# Patient Record
Sex: Female | Born: 1955 | Race: Black or African American | Hispanic: No | Smoking: Never smoker
Health system: Southern US, Community
[De-identification: ages and names within clinical notes are randomized; demographics above are authoritative.]

## PROBLEM LIST (undated history)

## (undated) DIAGNOSIS — I10 Essential (primary) hypertension: Secondary | ICD-10-CM

## (undated) HISTORY — PX: TUBAL LIGATION: SHX77

---

## 2007-01-29 ENCOUNTER — Emergency Department (HOSPITAL_COMMUNITY): Admission: EM | Admit: 2007-01-29 | Discharge: 2007-01-29 | Payer: Self-pay | Admitting: Emergency Medicine

## 2007-02-03 ENCOUNTER — Encounter: Admission: RE | Admit: 2007-02-03 | Discharge: 2007-02-03 | Payer: Self-pay | Admitting: Family Medicine

## 2007-02-09 ENCOUNTER — Encounter: Admission: RE | Admit: 2007-02-09 | Discharge: 2007-03-16 | Payer: Self-pay | Admitting: Family Medicine

## 2008-08-30 ENCOUNTER — Emergency Department (HOSPITAL_COMMUNITY): Admission: EM | Admit: 2008-08-30 | Discharge: 2008-08-30 | Payer: Self-pay | Admitting: Emergency Medicine

## 2011-06-06 LAB — RAPID STREP SCREEN (MED CTR MEBANE ONLY): Streptococcus, Group A Screen (Direct): NEGATIVE

## 2012-08-07 ENCOUNTER — Emergency Department (HOSPITAL_COMMUNITY)
Admission: EM | Admit: 2012-08-07 | Discharge: 2012-08-07 | Disposition: A | Discharge status: Home / Self Care | Payer: Worker's Compensation | Attending: Emergency Medicine | Admitting: Emergency Medicine

## 2012-08-07 ENCOUNTER — Encounter (HOSPITAL_COMMUNITY): Payer: Self-pay | Admitting: Emergency Medicine

## 2012-08-07 DIAGNOSIS — S01501A Unspecified open wound of lip, initial encounter: Secondary | ICD-10-CM | POA: Insufficient documentation

## 2012-08-07 DIAGNOSIS — Y9289 Other specified places as the place of occurrence of the external cause: Secondary | ICD-10-CM | POA: Insufficient documentation

## 2012-08-07 DIAGNOSIS — Y9389 Activity, other specified: Secondary | ICD-10-CM | POA: Insufficient documentation

## 2012-08-07 DIAGNOSIS — IMO0002 Reserved for concepts with insufficient information to code with codable children: Secondary | ICD-10-CM | POA: Insufficient documentation

## 2012-08-07 DIAGNOSIS — S01511A Laceration without foreign body of lip, initial encounter: Secondary | ICD-10-CM

## 2012-08-07 NOTE — ED Provider Notes (Signed)
History     CSN: [id number]  Arrival date & time 08/07/12  1016   None     No chief complaint on file.   (Consider location/radiation/quality/duration/timing/severity/associated sxs/prior treatment) HPI Comments: This is a 56 year old female, who presents emergency department with chief complaint of lip laceration. She states that the injury occurred while at work. She states that her pain is minimal. She want to come in and be evaluated due to bleeding and the presence of a laceration. Patient has not tried anything to alleviate her symptoms. Nothing makes her symptoms worse or better. Patient denies headache, blurred vision, new hearing loss, sore throat, chest pain, shortness of breath, nausea, vomiting, diarrhea, constipation, dysuria, peripheral edema, back pain, numbness or tingling of the extremities.   The history is provided by the patient. No language interpreter was used.    No past medical history on file.  No past surgical history on file.  No family history on file.  History  Substance Use Topics  . Smoking status: Not on file  . Smokeless tobacco: Not on file  . Alcohol Use: Not on file    OB History    No data available      Review of Systems  All other systems reviewed and are negative.    Allergies  Review of patient's allergies indicates no known allergies.  Home Medications   Current Outpatient Rx  Name  Route  Sig  Dispense  Refill  . AMLODIPINE BESYLATE 10 MG PO TABS   Oral   Take 10 mg by mouth daily.         . ADULT MULTIVITAMIN W/MINERALS CH   Oral   Take 1 tablet by mouth daily.         Marland Kitchen NAPROXEN SODIUM 220 MG PO TABS   Oral   Take 220 mg by mouth as needed. Pain           BP 151/64  Pulse 71  Temp 98.6 F (37 C) (Oral)  Resp 18  SpO2 100%  Physical Exam  Nursing note and vitals reviewed. Constitutional: She is oriented to person, place, and time. She appears well-developed and well-nourished.  HENT:  Head:  Normocephalic and atraumatic.       5 millimeter laceration on the interior upper lip. Bleeding is controlled. The laceration is approximately 1 mm deep. No signs of surrounding infection. The wound does not involve the visible lip or Vermillion border.  Eyes: Conjunctivae normal and EOM are normal.  Neck: Normal range of motion.  Cardiovascular: Normal rate.   Pulmonary/Chest: Effort normal.  Abdominal: She exhibits no distension.  Musculoskeletal: Normal range of motion.  Neurological: She is alert and oriented to person, place, and time.  Skin: Skin is dry.  Psychiatric: She has a normal mood and affect. Her behavior is normal. Judgment and thought content normal.    ED Course  Procedures (including critical care time)  Labs Reviewed - No data to display No results found.   1. Lip laceration       MDM  56 year old female with lip laceration. I have educated the patient regarding lip lacerations, and how this would heal on its own. No treatment is required. I did recommend that the patient use ice and or gel for her comfort. Patient inquired about infection, and I stated that she could use saltwater gargles. Return precautions have been given. Patient understands and agrees with the plan, she is stable and ready for discharge.  Roxy Horseman, PA-C 08/07/12 1048

## 2012-08-07 NOTE — ED Notes (Signed)
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 Pt with very small laceration to inside of upper lip, no bleeding at this time. Minimal swelling to upper lip.

## 2012-08-10 NOTE — ED Provider Notes (Signed)
Medical screening examination/treatment/procedure(s) were performed by non-physician practitioner and as supervising physician I was immediately available for consultation/collaboration.  Toy Baker, MD 08/10/12 [phone]

## 2016-05-12 ENCOUNTER — Encounter (HOSPITAL_COMMUNITY): Payer: Self-pay | Admitting: Emergency Medicine

## 2016-05-12 ENCOUNTER — Emergency Department (HOSPITAL_COMMUNITY)
Admission: EM | Admit: 2016-05-12 | Discharge: 2016-05-12 | Disposition: A | Discharge status: Home / Self Care | Payer: Worker's Compensation | Attending: Emergency Medicine | Admitting: Emergency Medicine

## 2016-05-12 DIAGNOSIS — Y939 Activity, unspecified: Secondary | ICD-10-CM | POA: Diagnosis not present

## 2016-05-12 DIAGNOSIS — Z791 Long term (current) use of non-steroidal anti-inflammatories (NSAID): Secondary | ICD-10-CM | POA: Insufficient documentation

## 2016-05-12 DIAGNOSIS — Y999 Unspecified external cause status: Secondary | ICD-10-CM | POA: Diagnosis not present

## 2016-05-12 DIAGNOSIS — S61012A Laceration without foreign body of left thumb without damage to nail, initial encounter: Secondary | ICD-10-CM

## 2016-05-12 DIAGNOSIS — Y929 Unspecified place or not applicable: Secondary | ICD-10-CM | POA: Diagnosis not present

## 2016-05-12 DIAGNOSIS — I10 Essential (primary) hypertension: Secondary | ICD-10-CM | POA: Diagnosis not present

## 2016-05-12 DIAGNOSIS — Z79899 Other long term (current) drug therapy: Secondary | ICD-10-CM | POA: Diagnosis not present

## 2016-05-12 DIAGNOSIS — W268XXA Contact with other sharp object(s), not elsewhere classified, initial encounter: Secondary | ICD-10-CM | POA: Diagnosis not present

## 2016-05-12 HISTORY — DX: Essential (primary) hypertension: I10

## 2016-05-12 MED ORDER — LIDOCAINE HCL (PF) 1 % IJ SOLN
10.0000 mL | Freq: Once | INTRAMUSCULAR | Status: AC
  Administered 2016-05-12: 10 mL

## 2016-05-12 MED ORDER — LIDOCAINE HCL 1 % IJ SOLN
INTRAMUSCULAR | Status: AC
  Filled 2016-05-12: qty 20

## 2016-05-12 NOTE — ED Provider Notes (Signed)
[facility]-EMERGENCY DEPT Provider Note   CSN: [id number] Arrival date & time: 05/12/16  1128  By signing my name below, I, [name], attest that this documentation has been prepared under the direction and in the presence of [employer], PA-C. Electronically Signed: [name], ED Scribe. 05/12/16. 12:20 PM.  History   Chief Complaint Chief Complaint  Patient presents with  . Laceration    l/thumb laceration. Bleeding controlled   The history is provided by the patient. No language interpreter was used.   HPI Comments: LASHAE Robles is a 60 y.o. female with a hx of HTN who presents to the Emergency Department complaining of a laceration to the dorsal aspect of the left thumb onset 2 hours ago. Pt reports that she accidentally sliced her thumb with a box cutter. Hemostasis achieved with direct pressure. Pt is utd on on her tdap. She denies color change, numbness, or weakness.   Past Medical History:  Diagnosis Date  . Hypertension     There are no active problems to display for this patient.   Past Surgical History:  Procedure Laterality Date  . TUBAL LIGATION      OB History    No data available      Home Medications    Prior to Admission medications   Medication Sig Start Date End Date Taking? Authorizing Provider  amLODipine (NORVASC) 10 MG tablet Take 10 mg by mouth daily.    Historical Provider, MD  Multiple Vitamin (MULTIVITAMIN WITH MINERALS) TABS Take 1 tablet by mouth daily.    Historical Provider, MD  naproxen sodium (ANAPROX) 220 MG tablet Take 220 mg by mouth as needed. Pain    Historical Provider, MD    Family History Family History  Problem Relation Age of Onset  . Hypertension Mother   . Hypertension Father     Social History Social History  Substance Use Topics  . Smoking status: Never Smoker  . Smokeless tobacco: Never Used  . Alcohol use No     Allergies   Review of patient's allergies indicates no known  allergies.   Review of Systems Review of Systems  Skin: Positive for wound. Negative for color change.  Neurological: Negative for weakness and numbness.  A complete 10 system review of systems was obtained and all systems are negative except as noted in the HPI and PMH.   Physical Exam Updated Vital Signs BP 163/78 (BP Location: Right Arm)   Pulse 71   Temp 97.8 F (36.6 C) (Oral)   Resp 18   SpO2 100%   Physical Exam  Constitutional: She is oriented to person, place, and time. She appears well-developed and well-nourished.  HENT:  Head: Normocephalic and atraumatic.  Eyes: Conjunctivae and EOM are normal. Pupils are equal, round, and reactive to light.  Neck: Normal range of motion. Neck supple.  Cardiovascular: Normal rate and regular rhythm.   Pulses:      Radial pulses are 2+ on the right side, and 2+ on the left side.  Pulmonary/Chest: Effort normal.  Musculoskeletal: Normal range of motion.  Left thumb: 2.5 cm linear laceration to the dorsum of the left thumb, bleeding controlled. 2+ radial pulses bilaterally; distal sensation on left hand is intact; full ROM of left thumb  Full ROM of the left thumb Normal muscle strength with resistance with the thumb  Neurological: She is alert and oriented to person, place, and time.  Skin: Skin is warm and dry. Capillary refill takes less than 2 seconds.  Psychiatric: She has a normal mood and affect. Her behavior is normal.  Nursing note and vitals reviewed.  ED Treatments / Results  DIAGNOSTIC STUDIES: Oxygen Saturation is 100% on RA, normal by my interpretation.    COORDINATION OF CARE: 12:18 PM-Discussed treatment plan which includes laceration repair with pt at bedside and pt agreed to plan.    Labs (all labs ordered are listed, but only abnormal results are displayed) Labs Reviewed - No data to display  EKG  EKG Interpretation None       Radiology No results found.  Procedures .Marland Kitchen.Laceration  Repair Date/Time: 05/12/2016 12:20 PM Performed by: Santiago GladLAISURE, Cesia Orf Authorized by: Santiago GladLAISURE, Sammantha Mehlhaff   Consent:    Consent obtained:  Verbal   Consent given by:  Patient Anesthesia (see MAR for exact dosages):    Anesthesia method:  Local infiltration   Local anesthetic:  Lidocaine 1% w/o epi Laceration details:    Location:  Finger   Finger location:  L thumb   Length (cm):  2.5 Repair type:    Repair type:  Simple Pre-procedure details:    Preparation:  Patient was prepped and draped in usual sterile fashion Exploration:    Hemostasis achieved with:  Direct pressure Treatment:    Amount of cleaning:  Standard   Irrigation solution:  Tap water Skin repair:    Repair method:  Sutures   Suture size:  4-0   Suture material:  Prolene   Suture technique:  Simple interrupted   Number of sutures:  3 Approximation:    Approximation:  Close   Vermilion border: well-aligned   Post-procedure details:    Dressing:  Antibiotic ointment (thumb spica)   Patient tolerance of procedure:  Tolerated well, no immediate complications     (including critical care time)  Medications Ordered in ED Medications  lidocaine (PF) (XYLOCAINE) 1 % injection 10 mL (not administered)  lidocaine (XYLOCAINE) 1 % (with pres) injection (not administered)     Initial Impression / Assessment and Plan / ED Course  I have reviewed the triage vital signs and the nursing notes.  Pertinent labs & imaging results that were available during my care of the patient were reviewed by me and considered in my medical decision making (see chart for details).  Clinical Course    Final Clinical Impressions(s) / ED Diagnoses   Pt is utd on tdap. Pressure, irrigation performed. Laceration occurred < 8 hours prior to repair which was well tolerated. No tendon involvement.  Full ROM of the thumb.  Neurovascularly intact.  Pt has no co morbidities to effect normal wound healing. Discussed suture home care w pt and  answered questions. Pt to f-u for wound check and suture removal in 7 days. Pt is hemodynamically stable w no complaints prior to dc.    Final diagnoses:  None  I personally performed the services described in this documentation, which was scribed in my presence. The recorded information has been reviewed and is accurate.    New Prescriptions New Prescriptions   No medications on file     Santiago GladHeather Jaisha Villacres, PA-C 05/12/16 1734    Doug SouSam Jacubowitz, MD 05/13/16 [phone]

## 2016-05-12 NOTE — ED Triage Notes (Signed)
1.5 cm laceration noted.Pt stated that she acciddently sliced the inner aspect of l/thumb with box cutter approx. 40 minutes ago.. Bleeding stopped with pressure. No bleeding at present

## 2016-06-27 ENCOUNTER — Other Ambulatory Visit: Payer: Self-pay | Admitting: Family Medicine

## 2016-06-27 ENCOUNTER — Other Ambulatory Visit (HOSPITAL_COMMUNITY)
Admission: RE | Admit: 2016-06-27 | Discharge: 2016-06-27 | Disposition: A | Discharge status: Home / Self Care | Payer: 59 | Source: Ambulatory Visit | Attending: Family Medicine | Admitting: Family Medicine

## 2016-06-27 DIAGNOSIS — Z01419 Encounter for gynecological examination (general) (routine) without abnormal findings: Secondary | ICD-10-CM | POA: Insufficient documentation

## 2016-06-30 LAB — CYTOLOGY - PAP: DIAGNOSIS: NEGATIVE

## 2016-12-26 DIAGNOSIS — E7800 Pure hypercholesterolemia, unspecified: Secondary | ICD-10-CM | POA: Diagnosis not present

## 2016-12-26 DIAGNOSIS — I10 Essential (primary) hypertension: Secondary | ICD-10-CM | POA: Diagnosis not present

## 2017-05-22 DIAGNOSIS — J309 Allergic rhinitis, unspecified: Secondary | ICD-10-CM | POA: Diagnosis not present

## 2017-05-22 DIAGNOSIS — I10 Essential (primary) hypertension: Secondary | ICD-10-CM | POA: Diagnosis not present

## 2017-08-19 DIAGNOSIS — R238 Other skin changes: Secondary | ICD-10-CM | POA: Diagnosis not present

## 2017-11-20 DIAGNOSIS — I10 Essential (primary) hypertension: Secondary | ICD-10-CM | POA: Diagnosis not present

## 2017-11-20 DIAGNOSIS — E7800 Pure hypercholesterolemia, unspecified: Secondary | ICD-10-CM | POA: Diagnosis not present

## 2018-06-01 DIAGNOSIS — Z0000 Encounter for general adult medical examination without abnormal findings: Secondary | ICD-10-CM | POA: Diagnosis not present

## 2018-06-01 DIAGNOSIS — I10 Essential (primary) hypertension: Secondary | ICD-10-CM | POA: Diagnosis not present

## 2018-08-23 ENCOUNTER — Other Ambulatory Visit: Payer: Self-pay | Admitting: Family Medicine

## 2018-08-23 DIAGNOSIS — Z1231 Encounter for screening mammogram for malignant neoplasm of breast: Secondary | ICD-10-CM

## 2018-08-23 DIAGNOSIS — E2839 Other primary ovarian failure: Secondary | ICD-10-CM

## 2018-10-13 ENCOUNTER — Other Ambulatory Visit: Payer: Self-pay

## 2018-10-13 ENCOUNTER — Ambulatory Visit
Admission: RE | Admit: 2018-10-13 | Discharge: 2018-10-13 | Disposition: A | Discharge status: Home / Self Care | Payer: 59 | Source: Ambulatory Visit | Attending: Family Medicine | Admitting: Family Medicine

## 2018-10-13 DIAGNOSIS — Z1231 Encounter for screening mammogram for malignant neoplasm of breast: Secondary | ICD-10-CM | POA: Diagnosis not present

## 2018-11-17 DIAGNOSIS — I10 Essential (primary) hypertension: Secondary | ICD-10-CM | POA: Diagnosis not present

## 2018-11-17 DIAGNOSIS — G4700 Insomnia, unspecified: Secondary | ICD-10-CM | POA: Diagnosis not present

## 2020-08-28 ENCOUNTER — Other Ambulatory Visit: Payer: Self-pay | Admitting: Family Medicine

## 2020-08-28 DIAGNOSIS — E2839 Other primary ovarian failure: Secondary | ICD-10-CM

## 2020-08-28 DIAGNOSIS — Z1231 Encounter for screening mammogram for malignant neoplasm of breast: Secondary | ICD-10-CM

## 2020-09-03 DIAGNOSIS — U071 COVID-19: Secondary | ICD-10-CM | POA: Diagnosis not present

## 2020-09-03 DIAGNOSIS — R059 Cough, unspecified: Secondary | ICD-10-CM | POA: Diagnosis not present

## 2020-09-05 DIAGNOSIS — U071 COVID-19: Secondary | ICD-10-CM | POA: Diagnosis not present

## 2020-09-05 DIAGNOSIS — R509 Fever, unspecified: Secondary | ICD-10-CM | POA: Diagnosis not present

## 2020-09-05 DIAGNOSIS — R059 Cough, unspecified: Secondary | ICD-10-CM | POA: Diagnosis not present

## 2020-12-13 ENCOUNTER — Other Ambulatory Visit: Payer: 59

## 2020-12-13 ENCOUNTER — Ambulatory Visit
Admission: RE | Admit: 2020-12-13 | Discharge: 2020-12-13 | Disposition: A | Discharge status: Home / Self Care | Payer: 59 | Source: Ambulatory Visit | Attending: Family Medicine | Admitting: Family Medicine

## 2020-12-13 ENCOUNTER — Other Ambulatory Visit: Payer: Self-pay

## 2020-12-13 DIAGNOSIS — E2839 Other primary ovarian failure: Secondary | ICD-10-CM

## 2020-12-13 DIAGNOSIS — Z1231 Encounter for screening mammogram for malignant neoplasm of breast: Secondary | ICD-10-CM | POA: Diagnosis not present

## 2020-12-13 DIAGNOSIS — Z780 Asymptomatic menopausal state: Secondary | ICD-10-CM | POA: Diagnosis not present

## 2020-12-13 DIAGNOSIS — M8589 Other specified disorders of bone density and structure, multiple sites: Secondary | ICD-10-CM | POA: Diagnosis not present

## 2021-01-29 DIAGNOSIS — Z1211 Encounter for screening for malignant neoplasm of colon: Secondary | ICD-10-CM | POA: Diagnosis not present

## 2021-02-13 DIAGNOSIS — Z5320 Procedure and treatment not carried out because of patient's decision for unspecified reasons: Secondary | ICD-10-CM | POA: Diagnosis not present

## 2021-02-13 DIAGNOSIS — F411 Generalized anxiety disorder: Secondary | ICD-10-CM | POA: Diagnosis not present

## 2021-02-13 DIAGNOSIS — E1169 Type 2 diabetes mellitus with other specified complication: Secondary | ICD-10-CM | POA: Diagnosis not present

## 2021-02-13 DIAGNOSIS — E7800 Pure hypercholesterolemia, unspecified: Secondary | ICD-10-CM | POA: Diagnosis not present

## 2021-02-13 DIAGNOSIS — R0981 Nasal congestion: Secondary | ICD-10-CM | POA: Diagnosis not present

## 2021-02-13 DIAGNOSIS — Z1389 Encounter for screening for other disorder: Secondary | ICD-10-CM | POA: Diagnosis not present

## 2021-02-13 DIAGNOSIS — I10 Essential (primary) hypertension: Secondary | ICD-10-CM | POA: Diagnosis not present

## 2021-02-15 DIAGNOSIS — L720 Epidermal cyst: Secondary | ICD-10-CM | POA: Diagnosis not present

## 2021-08-03 DIAGNOSIS — R0981 Nasal congestion: Secondary | ICD-10-CM | POA: Diagnosis not present

## 2021-08-03 DIAGNOSIS — R519 Headache, unspecified: Secondary | ICD-10-CM | POA: Diagnosis not present

## 2021-08-03 DIAGNOSIS — J069 Acute upper respiratory infection, unspecified: Secondary | ICD-10-CM | POA: Diagnosis not present

## 2021-08-03 DIAGNOSIS — R6883 Chills (without fever): Secondary | ICD-10-CM | POA: Diagnosis not present

## 2021-08-03 DIAGNOSIS — U071 COVID-19: Secondary | ICD-10-CM | POA: Diagnosis not present

## 2021-08-19 DIAGNOSIS — H5213 Myopia, bilateral: Secondary | ICD-10-CM | POA: Diagnosis not present

## 2021-08-19 DIAGNOSIS — H524 Presbyopia: Secondary | ICD-10-CM | POA: Diagnosis not present

## 2021-08-19 DIAGNOSIS — H2513 Age-related nuclear cataract, bilateral: Secondary | ICD-10-CM | POA: Diagnosis not present

## 2021-08-30 DIAGNOSIS — J069 Acute upper respiratory infection, unspecified: Secondary | ICD-10-CM | POA: Diagnosis not present

## 2021-09-17 DIAGNOSIS — I10 Essential (primary) hypertension: Secondary | ICD-10-CM | POA: Diagnosis not present

## 2021-09-17 DIAGNOSIS — Z23 Encounter for immunization: Secondary | ICD-10-CM | POA: Diagnosis not present

## 2021-09-17 DIAGNOSIS — E7800 Pure hypercholesterolemia, unspecified: Secondary | ICD-10-CM | POA: Diagnosis not present

## 2021-09-17 DIAGNOSIS — Z0000 Encounter for general adult medical examination without abnormal findings: Secondary | ICD-10-CM | POA: Diagnosis not present

## 2021-09-17 DIAGNOSIS — M8588 Other specified disorders of bone density and structure, other site: Secondary | ICD-10-CM | POA: Diagnosis not present

## 2021-09-17 DIAGNOSIS — Z5320 Procedure and treatment not carried out because of patient's decision for unspecified reasons: Secondary | ICD-10-CM | POA: Diagnosis not present

## 2021-09-17 DIAGNOSIS — E1169 Type 2 diabetes mellitus with other specified complication: Secondary | ICD-10-CM | POA: Diagnosis not present

## 2021-11-13 DIAGNOSIS — M79622 Pain in left upper arm: Secondary | ICD-10-CM | POA: Diagnosis not present

## 2021-11-13 DIAGNOSIS — I10 Essential (primary) hypertension: Secondary | ICD-10-CM | POA: Diagnosis not present

## 2022-01-20 ENCOUNTER — Other Ambulatory Visit: Payer: Self-pay | Admitting: Family Medicine

## 2022-01-20 DIAGNOSIS — Z1231 Encounter for screening mammogram for malignant neoplasm of breast: Secondary | ICD-10-CM

## 2022-02-03 ENCOUNTER — Ambulatory Visit
Admission: RE | Admit: 2022-02-03 | Discharge: 2022-02-03 | Disposition: A | Discharge status: Home / Self Care | Payer: No Typology Code available for payment source | Source: Ambulatory Visit | Attending: Family Medicine | Admitting: Family Medicine

## 2022-02-03 DIAGNOSIS — Z1231 Encounter for screening mammogram for malignant neoplasm of breast: Secondary | ICD-10-CM | POA: Diagnosis not present

## 2022-02-05 ENCOUNTER — Ambulatory Visit: Payer: 59

## 2022-02-05 ENCOUNTER — Other Ambulatory Visit: Payer: Self-pay | Admitting: Family Medicine

## 2022-02-05 ENCOUNTER — Ambulatory Visit
Admission: RE | Admit: 2022-02-05 | Discharge: 2022-02-05 | Disposition: A | Discharge status: Home / Self Care | Payer: 59 | Source: Ambulatory Visit | Attending: Family Medicine | Admitting: Family Medicine

## 2022-02-05 DIAGNOSIS — R928 Other abnormal and inconclusive findings on diagnostic imaging of breast: Secondary | ICD-10-CM

## 2022-03-12 DIAGNOSIS — E7800 Pure hypercholesterolemia, unspecified: Secondary | ICD-10-CM | POA: Diagnosis not present

## 2022-03-12 DIAGNOSIS — Z23 Encounter for immunization: Secondary | ICD-10-CM | POA: Diagnosis not present

## 2022-03-12 DIAGNOSIS — E1169 Type 2 diabetes mellitus with other specified complication: Secondary | ICD-10-CM | POA: Diagnosis not present

## 2022-03-12 DIAGNOSIS — Z5320 Procedure and treatment not carried out because of patient's decision for unspecified reasons: Secondary | ICD-10-CM | POA: Diagnosis not present

## 2022-03-12 DIAGNOSIS — F411 Generalized anxiety disorder: Secondary | ICD-10-CM | POA: Diagnosis not present

## 2022-03-12 DIAGNOSIS — I10 Essential (primary) hypertension: Secondary | ICD-10-CM | POA: Diagnosis not present

## 2022-06-03 DIAGNOSIS — F17211 Nicotine dependence, cigarettes, in remission: Secondary | ICD-10-CM | POA: Diagnosis not present

## 2022-06-03 DIAGNOSIS — E669 Obesity, unspecified: Secondary | ICD-10-CM | POA: Diagnosis not present

## 2022-06-03 DIAGNOSIS — E119 Type 2 diabetes mellitus without complications: Secondary | ICD-10-CM | POA: Diagnosis not present

## 2022-06-03 DIAGNOSIS — I10 Essential (primary) hypertension: Secondary | ICD-10-CM | POA: Diagnosis not present

## 2022-06-03 DIAGNOSIS — Z6831 Body mass index (BMI) 31.0-31.9, adult: Secondary | ICD-10-CM | POA: Diagnosis not present

## 2022-06-03 DIAGNOSIS — F419 Anxiety disorder, unspecified: Secondary | ICD-10-CM | POA: Diagnosis not present

## 2022-06-03 DIAGNOSIS — Z79899 Other long term (current) drug therapy: Secondary | ICD-10-CM | POA: Diagnosis not present

## 2022-06-03 DIAGNOSIS — Z008 Encounter for other general examination: Secondary | ICD-10-CM | POA: Diagnosis not present

## 2022-10-14 DIAGNOSIS — H269 Unspecified cataract: Secondary | ICD-10-CM | POA: Diagnosis not present

## 2022-10-14 DIAGNOSIS — E7800 Pure hypercholesterolemia, unspecified: Secondary | ICD-10-CM | POA: Diagnosis not present

## 2022-10-14 DIAGNOSIS — Z1331 Encounter for screening for depression: Secondary | ICD-10-CM | POA: Diagnosis not present

## 2022-10-14 DIAGNOSIS — M8588 Other specified disorders of bone density and structure, other site: Secondary | ICD-10-CM | POA: Diagnosis not present

## 2022-10-14 DIAGNOSIS — F411 Generalized anxiety disorder: Secondary | ICD-10-CM | POA: Diagnosis not present

## 2022-10-14 DIAGNOSIS — I10 Essential (primary) hypertension: Secondary | ICD-10-CM | POA: Diagnosis not present

## 2022-10-14 DIAGNOSIS — Z5320 Procedure and treatment not carried out because of patient's decision for unspecified reasons: Secondary | ICD-10-CM | POA: Diagnosis not present

## 2022-10-14 DIAGNOSIS — Z0000 Encounter for general adult medical examination without abnormal findings: Secondary | ICD-10-CM | POA: Diagnosis not present

## 2022-10-14 DIAGNOSIS — E1136 Type 2 diabetes mellitus with diabetic cataract: Secondary | ICD-10-CM | POA: Diagnosis not present

## 2022-12-09 DIAGNOSIS — R109 Unspecified abdominal pain: Secondary | ICD-10-CM | POA: Diagnosis not present

## 2022-12-09 DIAGNOSIS — J3489 Other specified disorders of nose and nasal sinuses: Secondary | ICD-10-CM | POA: Diagnosis not present

## 2022-12-09 DIAGNOSIS — I10 Essential (primary) hypertension: Secondary | ICD-10-CM | POA: Diagnosis not present

## 2023-02-03 ENCOUNTER — Other Ambulatory Visit: Payer: Self-pay | Admitting: Family Medicine

## 2023-02-03 DIAGNOSIS — Z1231 Encounter for screening mammogram for malignant neoplasm of breast: Secondary | ICD-10-CM

## 2023-02-19 IMAGING — MG MM DIGITAL DIAGNOSTIC UNILAT*R* W/ TOMO W/ CAD
6 series · 6 of 18 positions shown · non-contrast
Comparison: Previous exam(s).

CLINICAL DATA: 65-year-old female for further evaluation of
possible RIGHT breast distortion identified on screening mammogram.

EXAM:
DIGITAL DIAGNOSTIC UNILATERAL RIGHT MAMMOGRAM WITH TOMOSYNTHESIS AND
CAD
TECHNIQUE: Right digital diagnostic mammography and breast tomosynthesis was
performed. The images were evaluated with computer-aided detection.

[R MLO synth-2D]
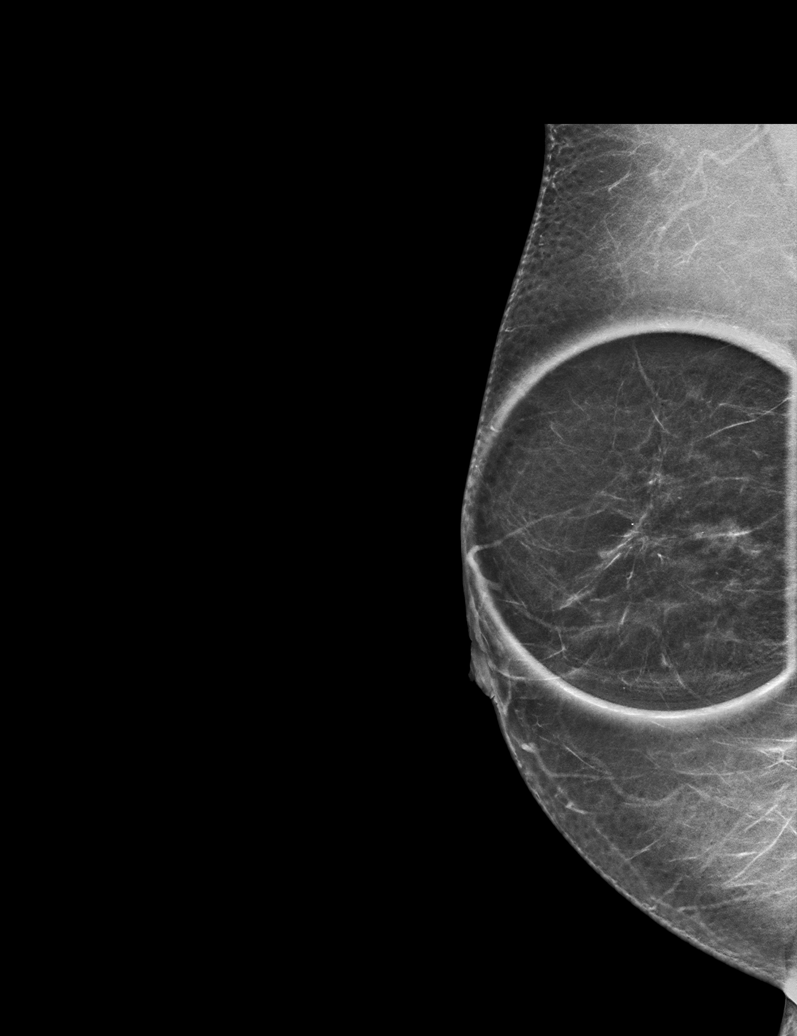

[R ML synth-2D (1 of 2)]
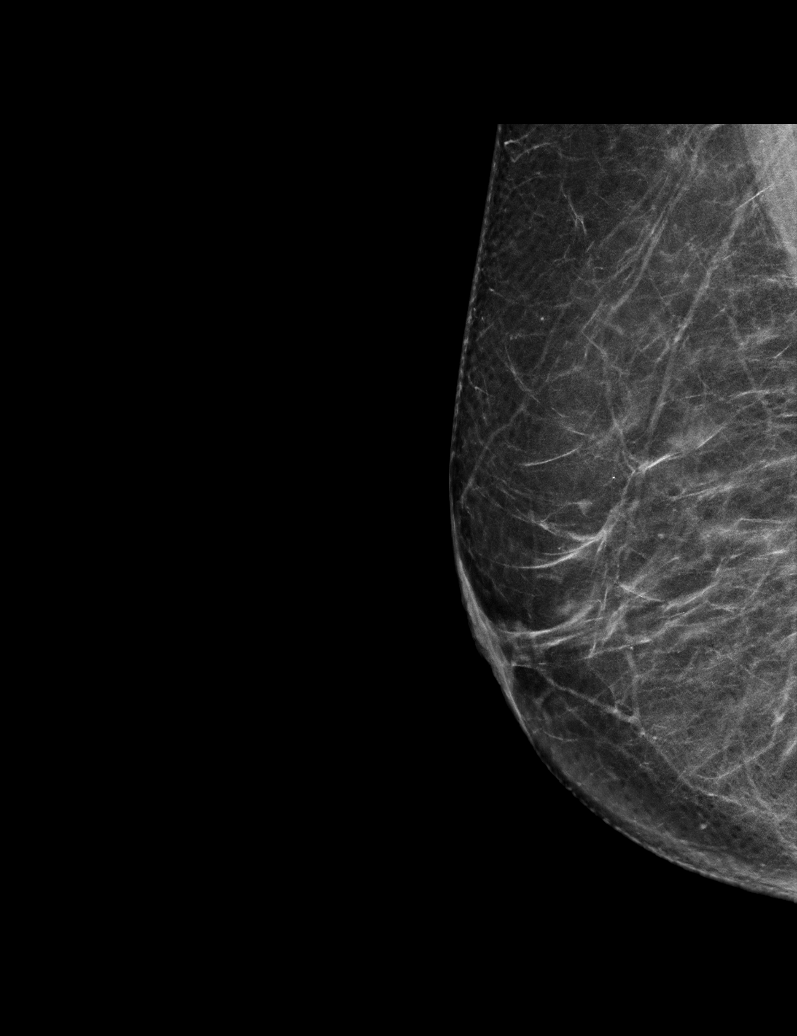

[R ML synth-2D (2 of 2)]
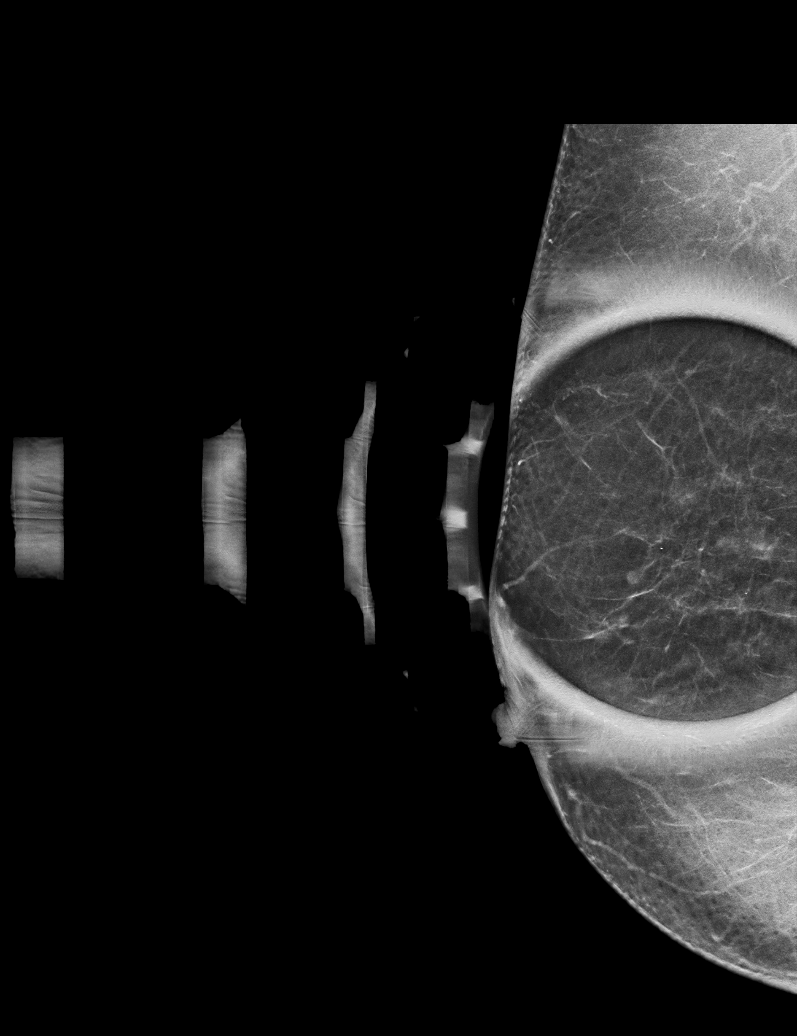

[R MLO tomo · tomo slice 28/55.0]
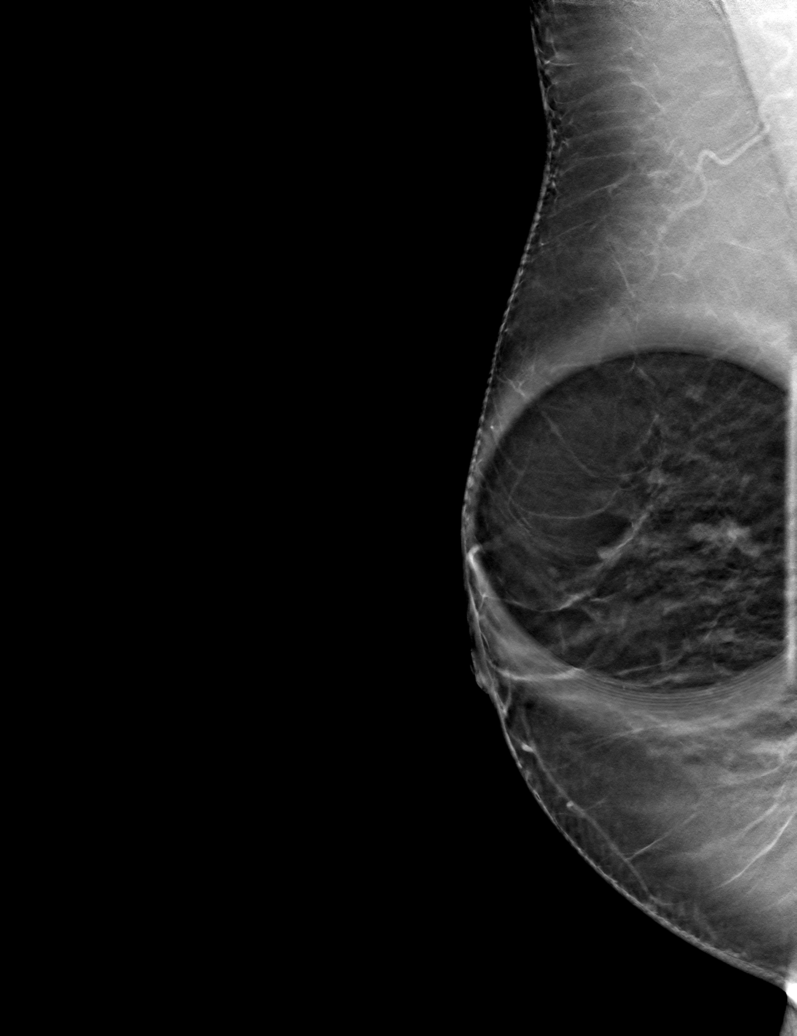

[R ML tomo (1 of 2) · tomo slice 35/69.0]
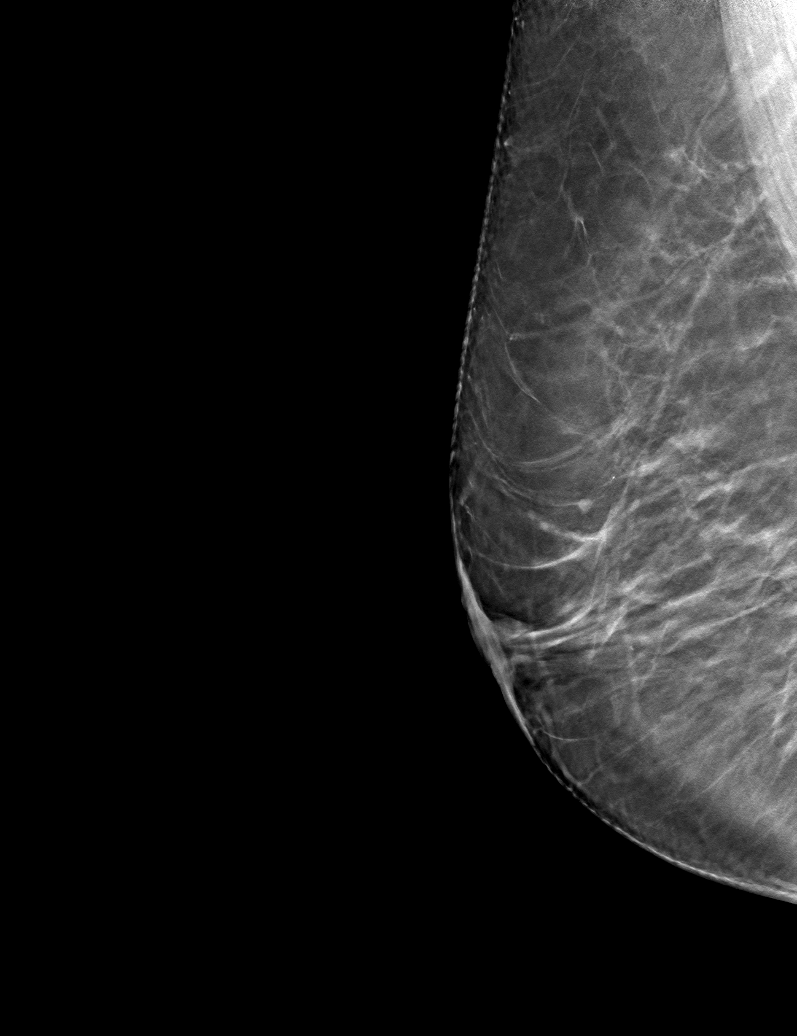

[R ML tomo (2 of 2) · tomo slice 29/56.0]
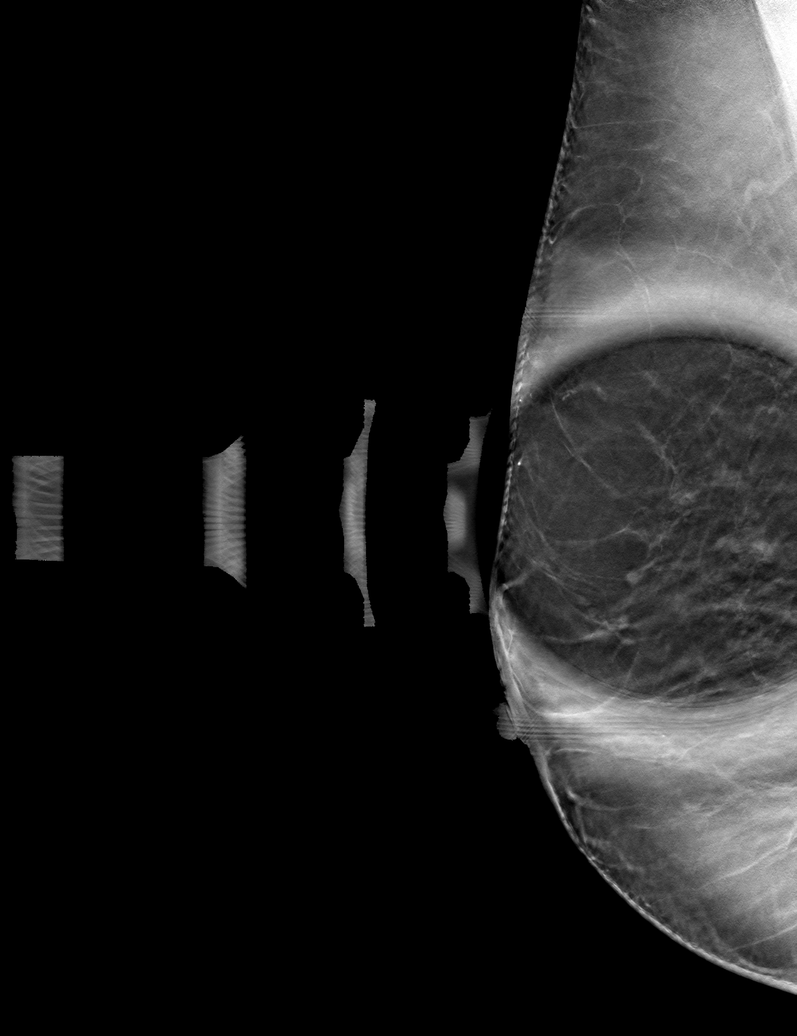

[6 of 18 positions shown; findings below may reference images not displayed]

ACR Breast Density Category b: There are scattered areas of
fibroglandular density.
FINDINGS: Full field and spot compression views of the RIGHT breast
demonstrate dispersal of the screening study finding without
persistent suspicious abnormality in this area.
IMPRESSION: No persistent suspicious abnormality at site of the screening study
finding.

RECOMMENDATION:
Bilateral screening mammogram in 1 year.

I have discussed the findings and recommendations with the patient.
If applicable, a reminder letter will be sent to the patient
regarding the next appointment.

BI-RADS CATEGORY  1: Negative.

## 2023-03-11 ENCOUNTER — Ambulatory Visit: Payer: No Typology Code available for payment source

## 2023-04-01 ENCOUNTER — Ambulatory Visit
Admission: RE | Admit: 2023-04-01 | Discharge: 2023-04-01 | Disposition: A | Discharge status: Home / Self Care | Payer: No Typology Code available for payment source | Source: Ambulatory Visit | Attending: Family Medicine | Admitting: Family Medicine

## 2023-04-01 DIAGNOSIS — Z1231 Encounter for screening mammogram for malignant neoplasm of breast: Secondary | ICD-10-CM | POA: Diagnosis not present

## 2023-04-22 DIAGNOSIS — E1136 Type 2 diabetes mellitus with diabetic cataract: Secondary | ICD-10-CM | POA: Diagnosis not present

## 2023-04-22 DIAGNOSIS — H269 Unspecified cataract: Secondary | ICD-10-CM | POA: Diagnosis not present

## 2023-04-22 DIAGNOSIS — I10 Essential (primary) hypertension: Secondary | ICD-10-CM | POA: Diagnosis not present

## 2023-04-22 DIAGNOSIS — E7800 Pure hypercholesterolemia, unspecified: Secondary | ICD-10-CM | POA: Diagnosis not present

## 2023-04-22 DIAGNOSIS — F411 Generalized anxiety disorder: Secondary | ICD-10-CM | POA: Diagnosis not present

## 2023-07-06 DIAGNOSIS — I10 Essential (primary) hypertension: Secondary | ICD-10-CM | POA: Diagnosis not present

## 2023-07-06 DIAGNOSIS — Z008 Encounter for other general examination: Secondary | ICD-10-CM | POA: Diagnosis not present

## 2023-08-06 DIAGNOSIS — H2513 Age-related nuclear cataract, bilateral: Secondary | ICD-10-CM | POA: Diagnosis not present

## 2023-08-06 DIAGNOSIS — R7303 Prediabetes: Secondary | ICD-10-CM | POA: Diagnosis not present

## 2023-11-16 DIAGNOSIS — E7800 Pure hypercholesterolemia, unspecified: Secondary | ICD-10-CM | POA: Diagnosis not present

## 2023-11-16 DIAGNOSIS — H269 Unspecified cataract: Secondary | ICD-10-CM | POA: Diagnosis not present

## 2023-11-16 DIAGNOSIS — J309 Allergic rhinitis, unspecified: Secondary | ICD-10-CM | POA: Diagnosis not present

## 2023-11-16 DIAGNOSIS — Z5320 Procedure and treatment not carried out because of patient's decision for unspecified reasons: Secondary | ICD-10-CM | POA: Diagnosis not present

## 2023-11-16 DIAGNOSIS — Z1331 Encounter for screening for depression: Secondary | ICD-10-CM | POA: Diagnosis not present

## 2023-11-16 DIAGNOSIS — M8588 Other specified disorders of bone density and structure, other site: Secondary | ICD-10-CM | POA: Diagnosis not present

## 2023-11-16 DIAGNOSIS — Z0000 Encounter for general adult medical examination without abnormal findings: Secondary | ICD-10-CM | POA: Diagnosis not present

## 2023-11-16 DIAGNOSIS — E1136 Type 2 diabetes mellitus with diabetic cataract: Secondary | ICD-10-CM | POA: Diagnosis not present

## 2023-11-16 DIAGNOSIS — F411 Generalized anxiety disorder: Secondary | ICD-10-CM | POA: Diagnosis not present

## 2023-11-16 DIAGNOSIS — I10 Essential (primary) hypertension: Secondary | ICD-10-CM | POA: Diagnosis not present

## 2024-01-30 DIAGNOSIS — H269 Unspecified cataract: Secondary | ICD-10-CM | POA: Diagnosis not present

## 2024-01-30 DIAGNOSIS — E7800 Pure hypercholesterolemia, unspecified: Secondary | ICD-10-CM | POA: Diagnosis not present

## 2024-01-30 DIAGNOSIS — F411 Generalized anxiety disorder: Secondary | ICD-10-CM | POA: Diagnosis not present

## 2024-01-30 DIAGNOSIS — I10 Essential (primary) hypertension: Secondary | ICD-10-CM | POA: Diagnosis not present

## 2024-02-29 DIAGNOSIS — E7800 Pure hypercholesterolemia, unspecified: Secondary | ICD-10-CM | POA: Diagnosis not present

## 2024-02-29 DIAGNOSIS — H269 Unspecified cataract: Secondary | ICD-10-CM | POA: Diagnosis not present

## 2024-02-29 DIAGNOSIS — I10 Essential (primary) hypertension: Secondary | ICD-10-CM | POA: Diagnosis not present

## 2024-02-29 DIAGNOSIS — F411 Generalized anxiety disorder: Secondary | ICD-10-CM | POA: Diagnosis not present

## 2024-03-15 NOTE — Progress Notes (Deleted)
 New Patient Note  RE: Julia Robles MRN: [id number] DOB: 04-29-1956 Date of Office Visit: 03/16/2024  Consult requested by: Sun, Vyvyan, MD Primary care provider: Sun, Vyvyan, MD  Chief Complaint: No chief complaint on file.  History of Present Illness: I had the pleasure of seeing Julia Robles for initial evaluation at the [facility] on 03/16/2024. She is a 68 y.o. female, who is referred here by Sun, Vyvyan, MD for the evaluation of allergic rhinitis.  Discussed the use of AI scribe software for clinical note transcription with the patient, who gave verbal consent to proceed.  History of Present Illness             She reports symptoms of ***. Symptoms have been going on for *** years. The symptoms are present *** all year around with worsening in ***. Other triggers include exposure to ***. Anosmia: ***. Headache: ***. She has used *** with ***fair improvement in symptoms. Sinus infections: ***. Previous work up includes: ***. Previous ENT evaluation: ***. Previous sinus imaging: ***. History of nasal polyps: ***. Last eye exam: ***. History of reflux: ***.  Assessment and Plan: [name] is a 68 y.o. female with: ***  Assessment and Plan               No follow-ups on file.  No orders of the defined types were placed in this encounter.  Lab Orders  No laboratory test(s) ordered today    Other allergy screening: Asthma: {Blank single:19197::yes,no} Rhino conjunctivitis: {Blank single:19197::yes,no} Food allergy: {Blank single:19197::yes,no} Medication allergy: {Blank single:19197::yes,no} Hymenoptera allergy: {Blank single:19197::yes,no} Urticaria: {Blank single:19197::yes,no} Eczema:{Blank single:19197::yes,no} History of recurrent infections suggestive of immunodeficency: {Blank single:19197::yes,no}  Diagnostics: Spirometry:  Tracings reviewed. Her effort: {Blank single:19197::Good reproducible  efforts.,It was hard to get consistent efforts and there is a question as to whether this reflects a maximal maneuver.,Poor effort, data can not be interpreted.} FVC: ***L FEV1: ***L, ***% predicted FEV1/FVC ratio: ***% Interpretation: {Blank single:19197::Spirometry consistent with mild obstructive disease,Spirometry consistent with moderate obstructive disease,Spirometry consistent with severe obstructive disease,Spirometry consistent with possible restrictive disease,Spirometry consistent with mixed obstructive and restrictive disease,Spirometry uninterpretable due to technique,Spirometry consistent with normal pattern,No overt abnormalities noted given today's efforts}.  Please see scanned spirometry results for details.  Skin Testing: {Blank single:19197::Select foods,Environmental allergy panel,Environmental allergy panel and select foods,Food allergy panel,None,Deferred due to recent antihistamines use}. *** Results discussed with patient/family.   Past Medical History: There are no active problems to display for this patient.  Past Medical History:  Diagnosis Date  . Hypertension    Past Surgical History: Past Surgical History:  Procedure Laterality Date  . TUBAL LIGATION     Medication List:  Current Outpatient Medications  Medication Sig Dispense Refill  . amLODipine (NORVASC) 10 MG tablet Take 10 mg by mouth daily.    . Multiple Vitamin (MULTIVITAMIN WITH MINERALS) TABS Take 1 tablet by mouth daily.    . naproxen sodium (ANAPROX) 220 MG tablet Take 220 mg by mouth as needed. Pain     No current facility-administered medications for this visit.   Allergies: No Known Allergies Social History: Social History   Socioeconomic History  . Marital status: Widowed    Spouse name: Not on file  . Number of children: Not on file  . Years of education: Not on file  . Highest education level: Not on file  Occupational History  . Not on file   Tobacco Use  . Smoking status: Never  . Smokeless  tobacco: Never  Substance and Sexual Activity  . Alcohol use: No  . Drug use: Not on file  . Sexual activity: Not on file  Other Topics Concern  . Not on file  Social History Narrative  . Not on file   Social Drivers of Health   Financial Resource Strain: Not on file  Food Insecurity: Not on file  Transportation Needs: Not on file  Physical Activity: Not on file  Stress: Not on file  Social Connections: Not on file   Lives in a ***. Smoking: *** Occupation: ***  Environmental HistorySurveyor, minerals in the house: Network engineer in the family room: {Blank single:19197::yes,no} Carpet in the bedroom: {Blank single:19197::yes,no} Heating: {Blank single:19197::electric,gas,heat pump} Cooling: {Blank single:19197::central,window,heat pump} Pet: {Blank single:19197::yes ***,no}  Family History: Family History  Problem Relation Age of Onset  . Hypertension Mother   . Hypertension Father   . Breast cancer Neg Hx    Problem                               Relation Asthma                                   *** Eczema                                *** Food allergy                          *** Allergic rhino conjunctivitis     ***  Review of Systems  Constitutional:  Negative for appetite change, chills, fever and unexpected weight change.  HENT:  Negative for congestion and rhinorrhea.   Eyes:  Negative for itching.  Respiratory:  Negative for cough, chest tightness, shortness of breath and wheezing.   Cardiovascular:  Negative for chest pain.  Gastrointestinal:  Negative for abdominal pain.  Genitourinary:  Negative for difficulty urinating.  Skin:  Negative for rash.  Neurological:  Negative for headaches.    Objective: There were no vitals taken for this visit. There is no height or weight on file to calculate BMI. Physical Exam Vitals and nursing note  reviewed.  Constitutional:      Appearance: Normal appearance. She is well-developed.  HENT:     Head: Normocephalic and atraumatic.     Right Ear: Tympanic membrane and external ear normal.     Left Ear: Tympanic membrane and external ear normal.     Nose: Nose normal.     Mouth/Throat:     Mouth: Mucous membranes are moist.     Pharynx: Oropharynx is clear.  Eyes:     Conjunctiva/sclera: Conjunctivae normal.  Cardiovascular:     Rate and Rhythm: Normal rate and regular rhythm.     Heart sounds: Normal heart sounds. No murmur heard.    No friction rub. No gallop.  Pulmonary:     Effort: Pulmonary effort is normal.     Breath sounds: Normal breath sounds. No wheezing, rhonchi or rales.  Musculoskeletal:     Cervical back: Neck supple.  Skin:    General: Skin is warm.     Findings: No rash.  Neurological:     Mental Status: She is alert and oriented to person, place, and time.  Psychiatric:  Behavior: Behavior normal.   The plan was reviewed with the patient/family, and all questions/concerned were addressed.  It was my pleasure to see Kamiryn today and participate in her care. Please feel free to contact me with any questions or concerns.  Sincerely,  Orlan Cramp, DO Allergy & Immunology  [facility]  [facility] office: [phone] [facility] office: [phone]

## 2024-03-16 ENCOUNTER — Ambulatory Visit: Payer: Self-pay | Admitting: Allergy

## 2024-03-18 ENCOUNTER — Encounter: Payer: Self-pay | Admitting: Advanced Practice Midwife

## 2024-03-21 ENCOUNTER — Ambulatory Visit (INDEPENDENT_AMBULATORY_CARE_PROVIDER_SITE_OTHER): Payer: Self-pay | Admitting: Internal Medicine

## 2024-03-21 ENCOUNTER — Encounter: Payer: Self-pay | Admitting: Internal Medicine

## 2024-03-21 ENCOUNTER — Other Ambulatory Visit: Payer: Self-pay

## 2024-03-21 VITALS — BP 138/78 | HR 60 | Temp 97.9°F | Resp 18 | Ht 69.0 in | Wt 228.3 lb

## 2024-03-21 DIAGNOSIS — J3489 Other specified disorders of nose and nasal sinuses: Secondary | ICD-10-CM | POA: Diagnosis not present

## 2024-03-21 DIAGNOSIS — J329 Chronic sinusitis, unspecified: Secondary | ICD-10-CM

## 2024-03-21 DIAGNOSIS — J310 Chronic rhinitis: Secondary | ICD-10-CM | POA: Diagnosis not present

## 2024-03-21 DIAGNOSIS — B9689 Other specified bacterial agents as the cause of diseases classified elsewhere: Secondary | ICD-10-CM

## 2024-03-21 MED ORDER — AMOXICILLIN-POT CLAVULANATE 875-125 MG PO TABS: 1.0000 | ORAL_TABLET | Freq: Two times a day (BID) | ORAL | 0 refills | Status: AC

## 2024-03-21 NOTE — Progress Notes (Signed)
 NEW PATIENT Date of Service/Encounter:   03/21/2024 Referring provider: Sun, Vyvyan, MD Primary care provider: Sun, Vyvyan, MD  Subjective:  Julia Robles is a 68 y.o. female with a PMHx of DM2, hypercholesteremia, GAD, HTN, cataracts presenting today for evaluation of sinus pressure and pain. History obtained from: chart review and patient.   Discussed the use of AI scribe software for clinical note transcription with the patient, who gave verbal consent to proceed.  History of Present Illness Julia Robles is a 68 year old female who presents with worsening sinus pressure and pain.  Chronic sinus pressure and pain - Sinus pressure and pain present for the past three months, worsening compared to baseline symptoms - Pressure primarily located around the eyes, with sensation of pressure extending toward the ears - Symptoms more severe than previous intermittent episodes, possibly exacerbated by weather changes, especially during rainy seasons - No current nasal drainage, but has experienced drainage in the past - No fevers associated with symptoms - No history of sinus, nasal, or ear surgeries  Altered taste sensation - Change in taste attributed to ongoing sinus issues  Response to prior treatments and medication intolerances - Ipratropium nasal spray caused epistaxis - Menthol-based inhalers ([employer]ixen [employer]eller and [employer]) provided symptomatic relief - Claritin previously tried without symptom improvement - No prior antibiotic treatment for sinus symptoms  Allergic and atopic history - No history of asthma, eczema, food allergies, or medication allergies - No prior allergy testing  Specialist evaluation - No prior evaluation by an ear, nose, and throat specialist  Family history of sinus disease - Family history of sinus problems     Chart Review:  Reviewed PCP notes from referral 11/26/23: congestion and pressure x 3 months, ipatropium caused nose bleeds,  discontinued, not on other meds, discussed OTC AH and AI referral   Past Medical History: Past Medical History:  Diagnosis Date   Hypertension    Medication List:  Current Outpatient Medications  Medication Sig Dispense Refill   amLODipine (NORVASC) 10 MG tablet Take 10 mg by mouth daily.     Multiple Vitamin (MULTIVITAMIN WITH MINERALS) TABS Take 1 tablet by mouth daily.     naproxen sodium (ANAPROX) 220 MG tablet Take 220 mg by mouth as needed. Pain     No current facility-administered medications for this visit.   Known Allergies:  No Known Allergies Past Surgical History: Past Surgical History:  Procedure Laterality Date   TUBAL LIGATION     Family History: Family History  Problem Relation Age of Onset   Allergic rhinitis Mother    Hypertension Mother    Allergic rhinitis Father    Hypertension Father    Angioedema Sister    Allergic rhinitis Sister    Allergic rhinitis Brother    Breast cancer Neg Hx    Social History: [place] lives in an apartment without water damage, electric heating, outdoor cats and dogs, no roaches, using DeSmet covers on the bed and the pillows.  SHe is a Event organiser.  Not exposed to fumes chemicals or dust..   ROS:  All other systems negative except as noted per HPI.  Objective:  Blood pressure 138/78, pulse 60, temperature 97.9 F (36.6 C), temperature source Temporal, resp. rate 18, height 5' 9 (1.753 m), weight 228 lb 4.8 oz (103.6 kg), SpO2 100%. Body mass index is 33.71 kg/m. Physical Exam:  General Appearance:  Alert, cooperative, no distress, appears stated age  Head:  Normocephalic, without obvious abnormality,  atraumatic  Eyes:  Conjunctiva clear, EOM's intact  Ears EACs normal bilaterally and normal TMs bilaterally  Nose: Nares normal, hypertrophic turbinates, normal mucosa, and no visible anterior polyps  Throat: Lips, tongue normal; teeth and gums normal, normal posterior oropharynx  Neck: Supple, symmetrical   Lungs:   clear to auscultation bilaterally, Respirations unlabored, no coughing  Heart:  regular rate and rhythm and no murmur, Appears well perfused  Extremities: No edema  Skin: Skin color, texture, turgor normal and no rashes or lesions on visualized portions of skin  Neurologic: No gross deficits   Diagnostics:  Labs:  Lab Orders  No laboratory test(s) ordered today     Assessment and Plan  Assessment and Plan Assessment & Plan Sinusitis Chronic sinus pressure and pain for three months, likely exacerbated by weather changes. Symptoms suggest possible sinus infection. Differential includes sinus infection versus non-allergic sinus issues. Claritin was ineffective. - Prescribed Augmentin  875 mg twice daily for 10 days. Advised to take with food. - Recommended live cultured yogurt or probiotic to prevent yeast infections. - Scheduled allergy testing to determine presence of allergies and whether this is contributing to sinus issues - okay to continue using mint stick if helps with nose as long as not irritating - consider saline rinses (NEIL med sinus rinse provided) twice daily to remove any trapped mucus  Follow up : August 11th at 8:30 AM (1-55), must be off antihistamines 3 days prior to visit It was a pleasure meeting you in clinic today! Thank you for allowing me to participate in your care.  Rocky Endow, MD [facility]       This note in its entirety was forwarded to the Provider who requested this consultation.  Other: none  Thank you for your kind referral. I appreciate the opportunity to take part in Glenora's care. Please do not hesitate to contact me with questions.  Sincerely,  Rocky Endow, MD [facility] 

## 2024-03-21 NOTE — Patient Instructions (Signed)
 Sinusitis Chronic sinus pressure and pain for three months, likely exacerbated by weather changes. Symptoms suggest possible sinus infection. Differential includes sinus infection versus non-allergic sinus issues. Claritin was ineffective. - Prescribed Augmentin  875 mg twice daily for 10 days. Advised to take with food. - Recommended live cultured yogurt or probiotic to prevent yeast infections. - Scheduled allergy testing to determine presence of allergies and whether this is contributing to sinus issues - okay to continue using mint stick if helps with nose as long as not irritating - consider saline rinses (NEIL med sinus rinse provided) twice daily to remove any trapped mucus  Follow up : August 11th at 8:30 AM (1-55), must be off antihistamines 3 days prior to visit It was a pleasure meeting you in clinic today! Thank you for allowing me to participate in your care.

## 2024-03-31 DIAGNOSIS — H269 Unspecified cataract: Secondary | ICD-10-CM | POA: Diagnosis not present

## 2024-03-31 DIAGNOSIS — F411 Generalized anxiety disorder: Secondary | ICD-10-CM | POA: Diagnosis not present

## 2024-03-31 DIAGNOSIS — I10 Essential (primary) hypertension: Secondary | ICD-10-CM | POA: Diagnosis not present

## 2024-03-31 DIAGNOSIS — E7800 Pure hypercholesterolemia, unspecified: Secondary | ICD-10-CM | POA: Diagnosis not present

## 2024-04-11 ENCOUNTER — Ambulatory Visit (INDEPENDENT_AMBULATORY_CARE_PROVIDER_SITE_OTHER): Admitting: Internal Medicine

## 2024-04-11 ENCOUNTER — Encounter: Payer: Self-pay | Admitting: Internal Medicine

## 2024-04-11 DIAGNOSIS — J310 Chronic rhinitis: Secondary | ICD-10-CM

## 2024-04-11 NOTE — Patient Instructions (Addendum)
 Sinusitis Chronic sinus pressure and pain for three months, likely exacerbated by weather changes. Symptoms suggest possible sinus infection. Differential includes sinus infection versus non-allergic sinus issues. Claritin was ineffective. Interval hx: some improvement following treatment with augmentin , now symptoms return after recent rain - environmental allergy  skin testing 04/11/24: negative but inadequate controls, testing invalid. Recommended labs for confirmation - please stop mint smelling nose sticks - start saline rinses (NEIL med sinus rinse provided) twice daily to remove any trapped mucus - if testing negative, consider ENT referral  Follow up : as needed It was a pleasure seeing you again in clinic today! Thank you for allowing me to participate in your care.

## 2024-04-11 NOTE — Progress Notes (Signed)
  Date of Service/Encounter:  04/11/24  Allergy  testing appointment   Initial visit on 03/21/24, seen for recurrent sinusitis.  Please see that note for additional details.  Today reports for allergy  diagnostic testing:    DIAGNOSTICS:  Skin Testing: Environmental allergy  panel. Inadequate positive control-testing invalid. Results discussed with patient/family.  Airborne Adult Perc - 04/11/24 0839     Time Antigen Placed 9160    Allergen Manufacturer Jestine    Location Back    Number of Test 55    1. Control-Buffer 50% Glycerol Negative    2. Control-Histamine Negative    3. Bahia Negative    4. French Southern Territ[place] Negative    5. Johnson Negative    6. [place]  Blue Negative    7. Meadow Fescue Negative    8. Perennial Rye Negative    9. Timothy Negative    10. Ragweed Mix Negative    11. Cocklebur Negative    12. Plantain,  English Negative    13. Baccharis Negative    14. Dog Fennel Negative    15. Russian Thistle Negative    16. Lamb's Quarters Negative    17. Sheep Sorrell Negative    18. Rough Pigweed Negative    19. Marsh Elder, Rough Negative    20. Mugwort, Common Negative    21. Box, Elder Negative    22. Cedar, red Negative    23. Sweet Gum Negative    24. Pecan Pollen Negative    25. Pine Mix Negative    26. Walnut, Black Pollen Negative    27. Red Mulberry Negative    28. Ash Mix Negative    29. Birch Mix Negative    30. Beech American Negative    31. Cottonwood, [place] Negative    32. Hickory, White Negative    33. Maple Mix Negative    34. Oak, [place] Mix Negative    35. Sycamore Eastern Negative    36. Alternaria Alternata Negative    37. Cladosporium Herbarum Negative    38. Aspergillus Mix Negative    39. Penicillium Mix Negative    40. Bipolaris Sorokiniana (Helminthosporium) Negative    41. Drechslera Spicifera (Curvularia) Negative    42. Mucor Plumbeus Negative    43. Fusarium Moniliforme Negative    44. Aureobasidium Pullulans (pullulara)  Negative    45. Rhizopus Oryzae Negative    46. Botrytis Cinera Negative    47. Epicoccum Nigrum Negative    48. Phoma Betae Negative    49. Dust Mite Mix Negative    50. Cat Hair 10,000 BAU/ml Negative    51.  Dog Epithelia Negative    52. Mixed Feathers Negative    53. Horse Epithelia Negative    54. Cockroach, German Negative    55. Tobacco Leaf Negative          Allergy  testing results were read and interpreted by myself, documented by clinical staff.  Patient provided with copy of allergy  testing along with avoidance measures when indicated.   Rocky Endow, MD  [facility]

## 2024-04-13 ENCOUNTER — Ambulatory Visit: Payer: Self-pay | Admitting: Internal Medicine

## 2024-04-13 LAB — IGE+ALLERGENS ZONE 2(30)
Alternaria Alternata IgE: <0.1 kU/L
Amer Sycamore IgE Qn: <0.1 kU/L
Aspergillus Fumigatus IgE: <0.1 kU/L
Bahia Grass IgE: <0.1 kU/L
Bermuda Grass IgE: <0.1 kU/L
Cat Dander IgE: <0.1 kU/L
Cedar, Mountain IgE: <0.1 kU/L
Cladosporium Herbarum IgE: <0.1 kU/L
Cockroach, American IgE: <0.1 kU/L
Common Silver Birch IgE: <0.1 kU/L
D Farinae IgE: 0.1 kU/L — AB
D Pteronyssinus IgE: 0.15 kU/L — AB
Dog Dander IgE: <0.1 kU/L
Elm, American IgE: <0.1 kU/L
Hickory, White IgE: <0.1 kU/L
IgE (Immunoglobulin E), Serum: 58 [IU]/mL (ref 6–495)
Johnson Grass IgE: <0.1 kU/L
Maple/Box Elder IgE: <0.1 kU/L
Mucor Racemosus IgE: <0.1 kU/L
Mugwort IgE Qn: <0.1 kU/L
Nettle IgE: <0.1 kU/L
Oak, White IgE: <0.1 kU/L
Penicillium Chrysogen IgE: <0.1 kU/L
Pigweed, Rough IgE: <0.1 kU/L
Plantain, English IgE: <0.1 kU/L
Ragweed, Short IgE: <0.1 kU/L
Sheep Sorrel IgE Qn: <0.1 kU/L
Stemphylium Herbarum IgE: <0.1 kU/L
Sweet gum IgE RAST Ql: <0.1 kU/L
Timothy Grass IgE: <0.1 kU/L
White Mulberry IgE: <0.1 kU/L

## 2024-05-01 DIAGNOSIS — E7800 Pure hypercholesterolemia, unspecified: Secondary | ICD-10-CM | POA: Diagnosis not present

## 2024-05-01 DIAGNOSIS — I10 Essential (primary) hypertension: Secondary | ICD-10-CM | POA: Diagnosis not present

## 2024-05-01 DIAGNOSIS — H269 Unspecified cataract: Secondary | ICD-10-CM | POA: Diagnosis not present

## 2024-05-01 DIAGNOSIS — F411 Generalized anxiety disorder: Secondary | ICD-10-CM | POA: Diagnosis not present

## 2024-05-18 DIAGNOSIS — F411 Generalized anxiety disorder: Secondary | ICD-10-CM | POA: Diagnosis not present

## 2024-05-18 DIAGNOSIS — E7800 Pure hypercholesterolemia, unspecified: Secondary | ICD-10-CM | POA: Diagnosis not present

## 2024-05-18 DIAGNOSIS — Z5320 Procedure and treatment not carried out because of patient's decision for unspecified reasons: Secondary | ICD-10-CM | POA: Diagnosis not present

## 2024-05-18 DIAGNOSIS — I10 Essential (primary) hypertension: Secondary | ICD-10-CM | POA: Diagnosis not present

## 2024-05-18 DIAGNOSIS — E1136 Type 2 diabetes mellitus with diabetic cataract: Secondary | ICD-10-CM | POA: Diagnosis not present

## 2024-05-18 DIAGNOSIS — H269 Unspecified cataract: Secondary | ICD-10-CM | POA: Diagnosis not present

## 2024-05-31 DIAGNOSIS — I10 Essential (primary) hypertension: Secondary | ICD-10-CM | POA: Diagnosis not present

## 2024-05-31 DIAGNOSIS — E7800 Pure hypercholesterolemia, unspecified: Secondary | ICD-10-CM | POA: Diagnosis not present

## 2024-05-31 DIAGNOSIS — F411 Generalized anxiety disorder: Secondary | ICD-10-CM | POA: Diagnosis not present

## 2024-05-31 DIAGNOSIS — H269 Unspecified cataract: Secondary | ICD-10-CM | POA: Diagnosis not present
